# Patient Record
Sex: Male | Born: 2000 | Race: Black or African American | Hispanic: No | Marital: Single | Smoking: Never smoker
Health system: Southern US, Community
[De-identification: ages and names within clinical notes are randomized; demographics above are authoritative.]

---
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
## 2019-12-29 IMAGING — CR DG SHOULDER 2+V*L*
3 series · 3 of 3 positions shown · non-contrast
Comparison: None.

CLINICAL DATA: Skateboard accident

EXAM:
LEFT SHOULDER - 2+ VIEW

[shoulder grashey]
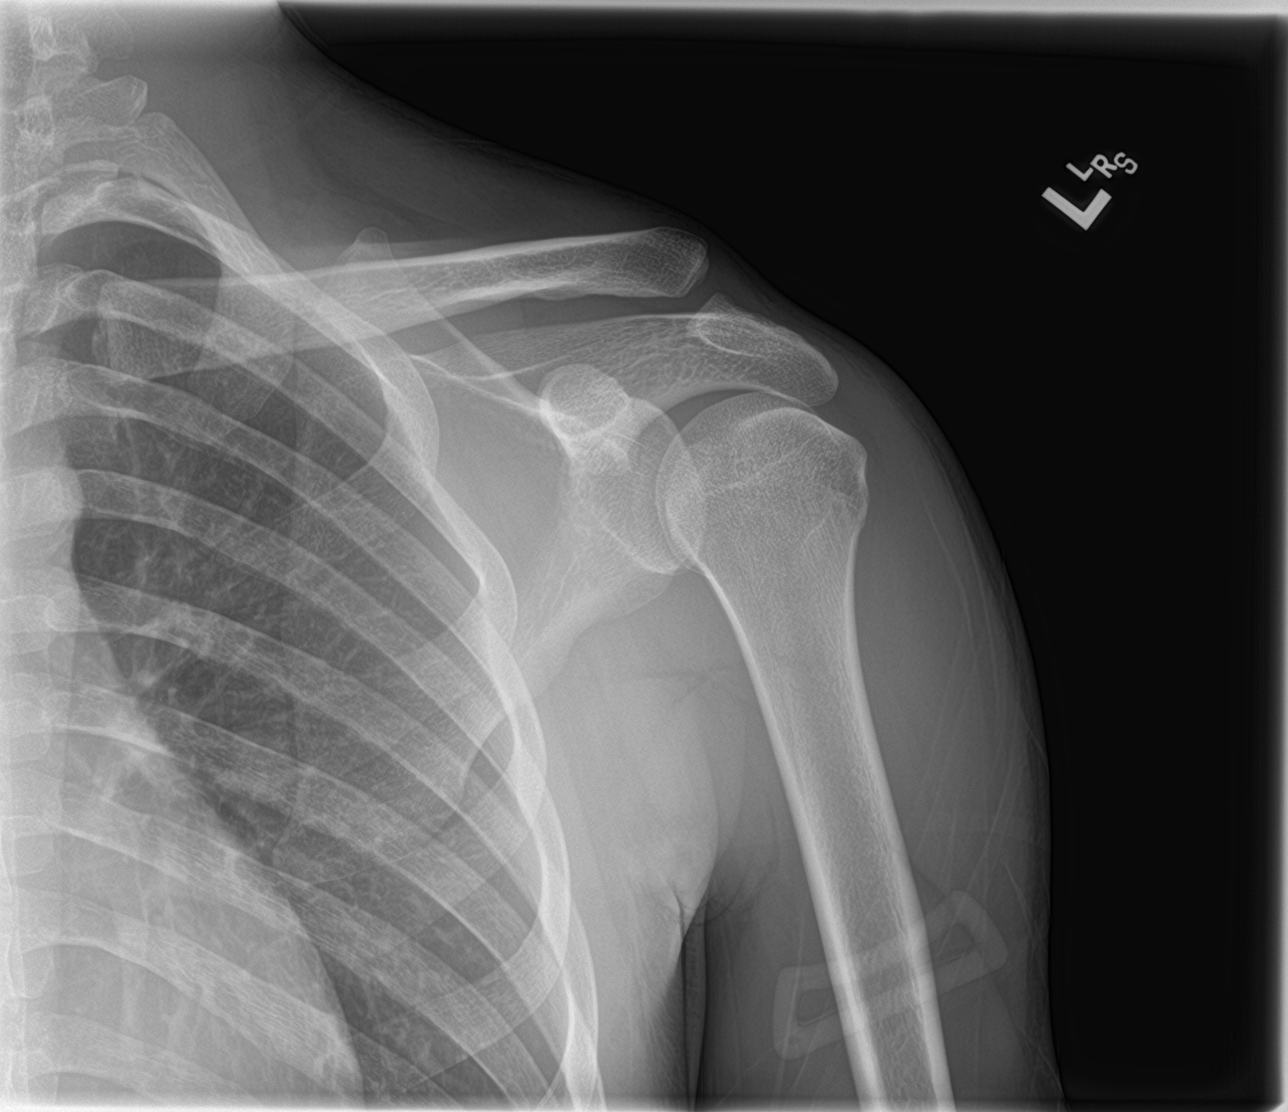

[shoulder y view]
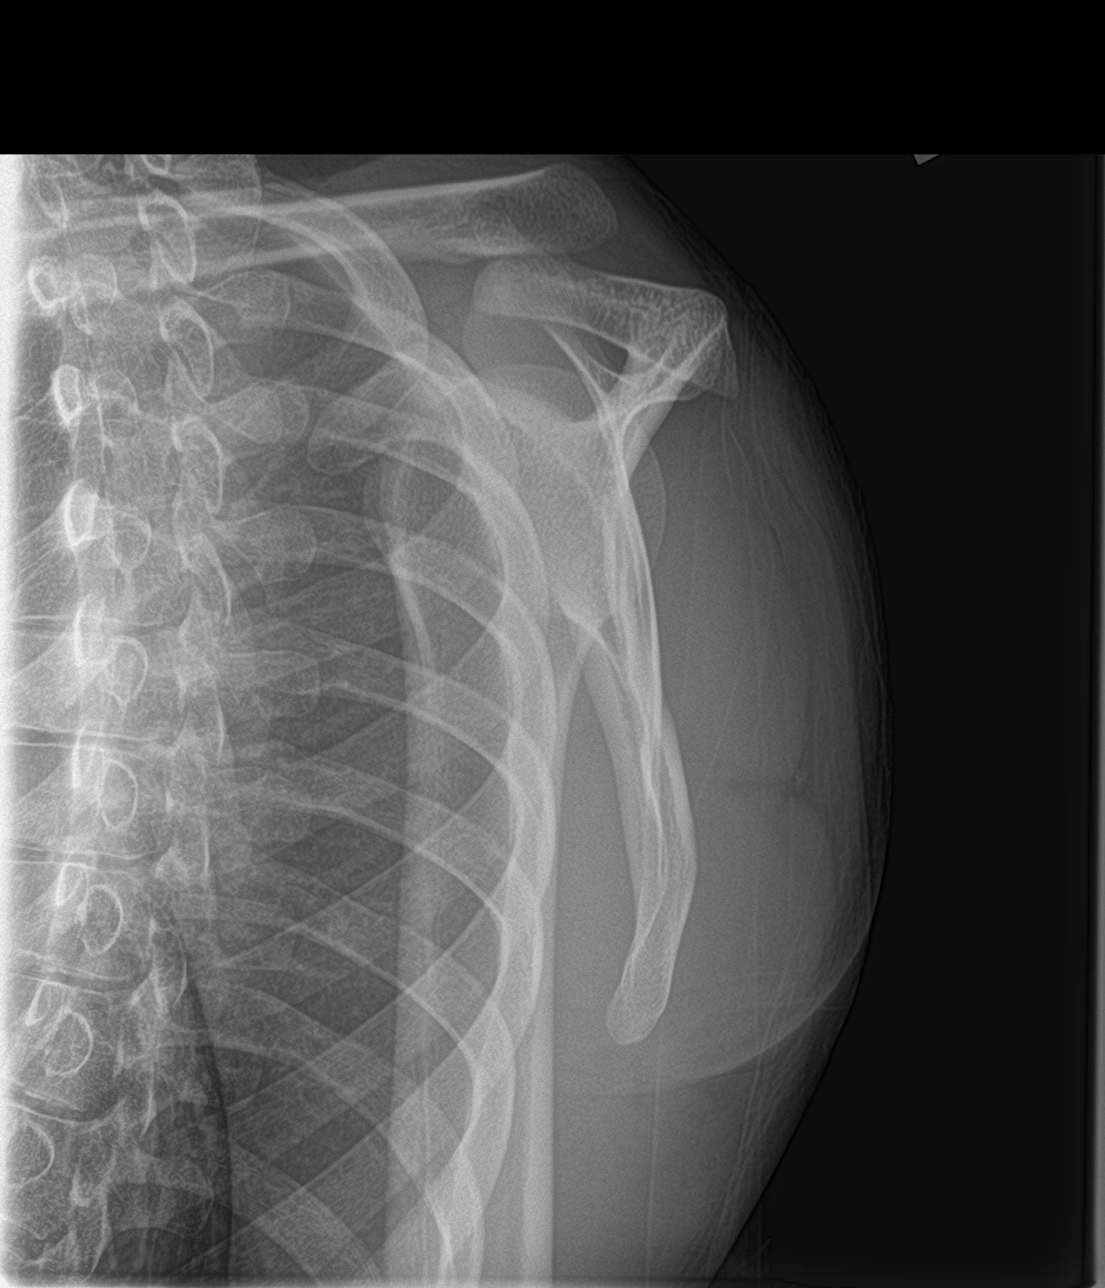

[shoulder ap neutral]
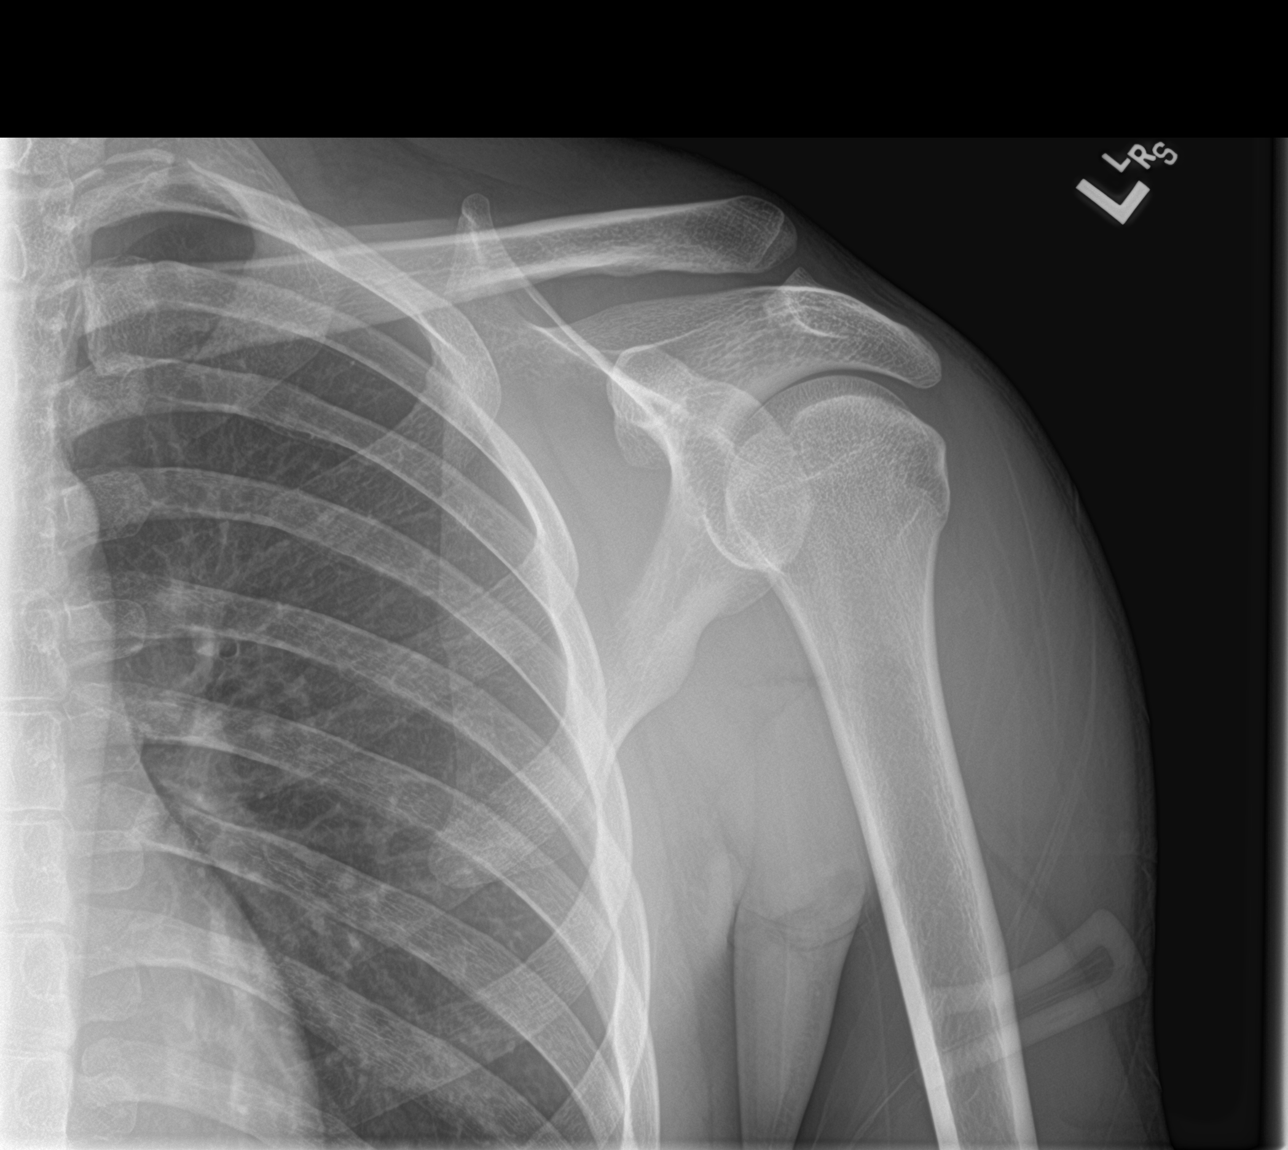

[3 of 3 positions shown; findings below may reference images not displayed]

FINDINGS: No fracture or dislocation is seen.

The joint spaces are preserved.

The visualized soft tissues are unremarkable.

Visualized left lung is clear.
IMPRESSION: Negative.

## 2019-12-29 IMAGING — CT CT CERVICAL SPINE W/O CM
5 of 8 series · 13 of 33 positions shown, 14 images · non-contrast
Comparison: None.

CLINICAL DATA: Skateboard accident, fall, possible head injury,
dizziness

EXAM:
CT HEAD WITHOUT CONTRAST
CT CERVICAL SPINE WITHOUT CONTRAST
TECHNIQUE: Multidetector CT imaging of the head and cervical spine was
performed following the standard protocol without intravenous
contrast. Multiplanar CT image reconstructions of the cervical spine
were also generated.

[Series 5: head bone · axial · 0.42mm/px · z∈[-61,-7]mm · 2 of 81 slices shown]
[im 27/81  bone]
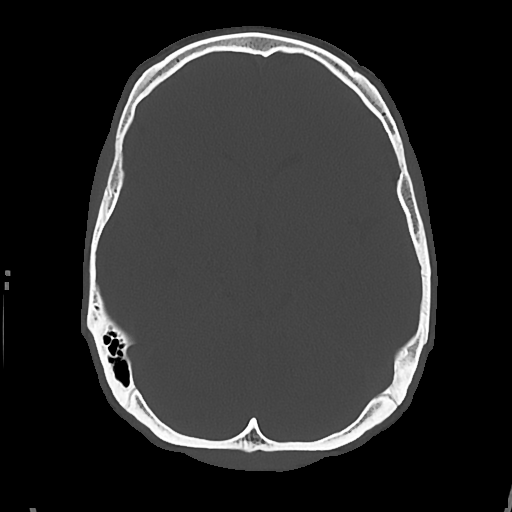
[im 54/81  bone]
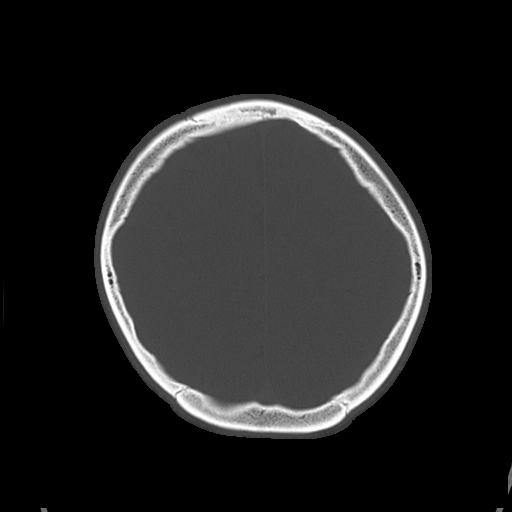

[Series 6: cor soft · coronal · 0.31mm/px · 2 of 67 slices shown]
[im 23/67  bone]
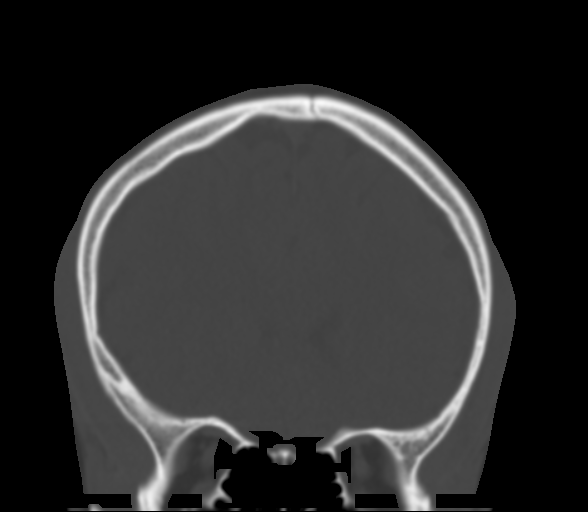
[im 45/67  bone]
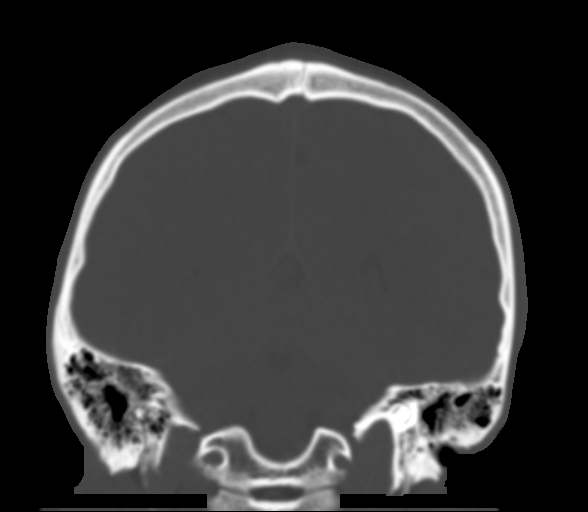

[Series 9: c spine soft · axial · 0.30mm/px · z∈[-192,-136]mm · 2 of 85 slices shown]
[im 29/85  soft-tissue]
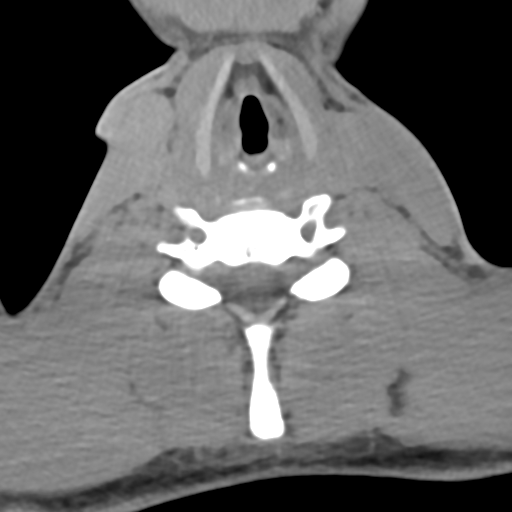
[im 57/85  soft-tissue]
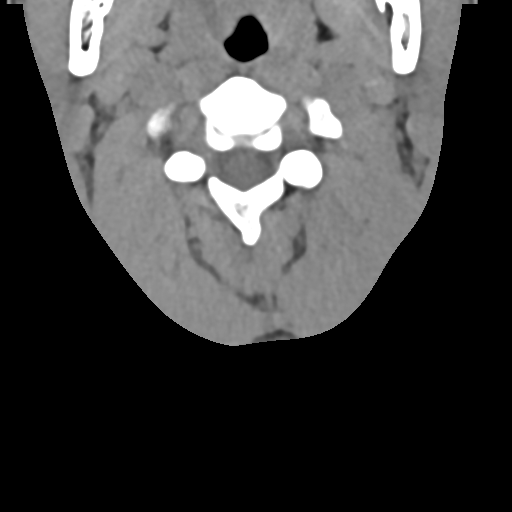

[Series 10: sag bone · sagittal · 0.30mm/px · 5 of 61 slices shown]
[im 11/61  bone]
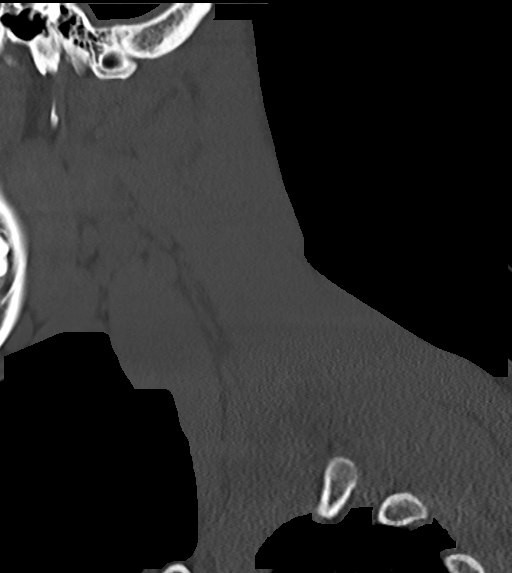
[im 21/61  bone]
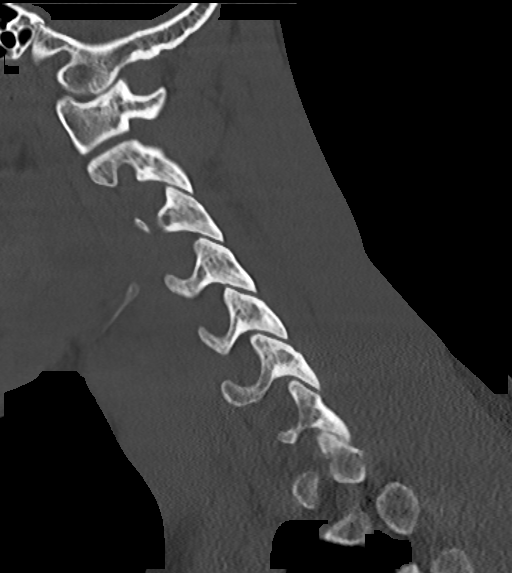
[im 31/61  bone]
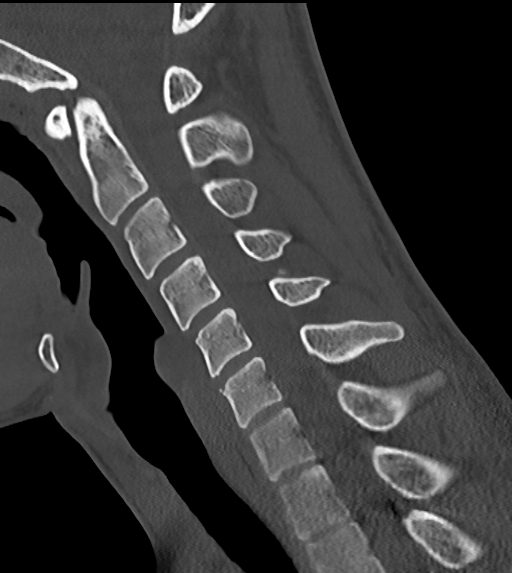
[im 41/61  bone]
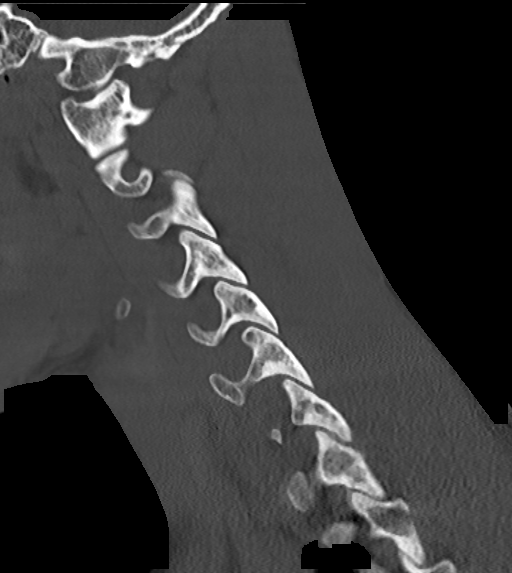
[im 51/61  bone]
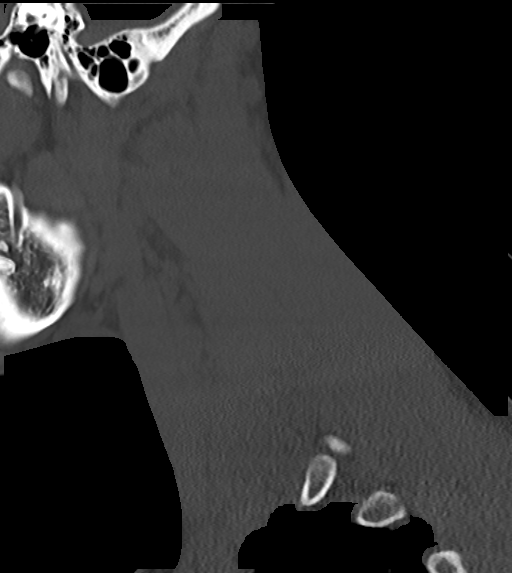

[Series 12: orthogonal axials · axial · 0.21mm/px · z∈[-216,-162]mm · 2 of 93 slices shown, 3 images]
[im 31/93  soft-tissue]
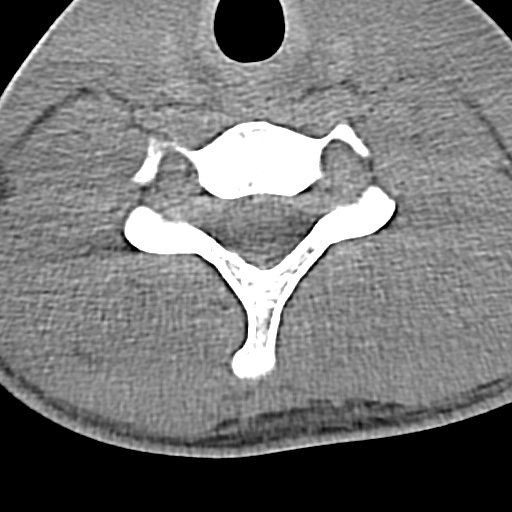
[im 31/93  bone]
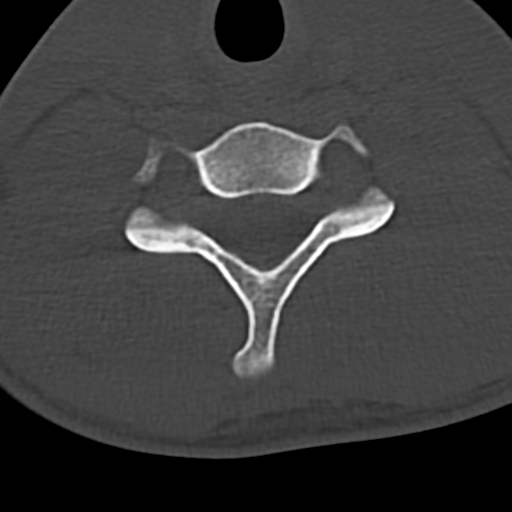
[im 62/93  bone]
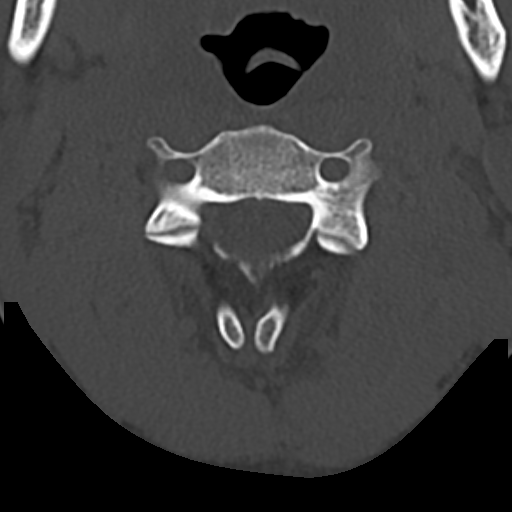

[13 of 33 positions shown; findings below may reference images not displayed]

FINDINGS: CT HEAD FINDINGS

Brain: No evidence of acute infarction, hemorrhage, hydrocephalus,
extra-axial collection or mass lesion/mass effect.

Vascular: No hyperdense vessel or unexpected calcification.

Skull: Normal. Negative for fracture or focal lesion.

Sinuses/Orbits: The visualized paranasal sinuses are essentially
clear. The mastoid air cells are unopacified.

Other: None.

CT CERVICAL SPINE FINDINGS

Alignment: Straightening of the cervical spine, likely positional.

Skull base and vertebrae: No acute fracture. No primary bone lesion
or focal pathologic process.

Soft tissues and spinal canal: No prevertebral fluid or swelling. No
visible canal hematoma.

Disc levels: Vertebral body heights and intervertebral disc spaces
are maintained. Spinal canal is patent.

Upper chest: Visualized lung apices are clear.

Other: Visualized thyroid is unremarkable.
IMPRESSION: Normal head CT.

Normal cervical spine CT.
